# Patient Record
Sex: Female | Born: 1960 | Race: White | Hispanic: No | Marital: Married | Smoking: Former smoker
Health system: Southern US, Community
[De-identification: ages and names within clinical notes are randomized; demographics above are authoritative.]

## PROBLEM LIST (undated history)

## (undated) DIAGNOSIS — F329 Major depressive disorder, single episode, unspecified: Secondary | ICD-10-CM

## (undated) DIAGNOSIS — M1990 Unspecified osteoarthritis, unspecified site: Secondary | ICD-10-CM

## (undated) DIAGNOSIS — E282 Polycystic ovarian syndrome: Secondary | ICD-10-CM

## (undated) DIAGNOSIS — F419 Anxiety disorder, unspecified: Secondary | ICD-10-CM

## (undated) DIAGNOSIS — F32A Depression, unspecified: Secondary | ICD-10-CM

## (undated) HISTORY — DX: Polycystic ovarian syndrome: E28.2

## (undated) HISTORY — PX: KNEE SURGERY: SHX244

## (undated) HISTORY — DX: Major depressive disorder, single episode, unspecified: F32.9

## (undated) HISTORY — DX: Depression, unspecified: F32.A

## (undated) HISTORY — DX: Anxiety disorder, unspecified: F41.9

## (undated) HISTORY — DX: Unspecified osteoarthritis, unspecified site: M19.90

---

## 1982-01-20 HISTORY — PX: APPENDECTOMY: SHX54

## 2003-04-11 ENCOUNTER — Other Ambulatory Visit: Admission: RE | Admit: 2003-04-11 | Discharge: 2003-04-11 | Payer: Self-pay | Admitting: Obstetrics and Gynecology

## 2003-10-27 ENCOUNTER — Ambulatory Visit (HOSPITAL_COMMUNITY): Admission: RE | Admit: 2003-10-27 | Discharge: 2003-10-27 | Payer: Self-pay | Admitting: Obstetrics and Gynecology

## 2004-01-21 HISTORY — PX: LASIK: SHX215

## 2007-06-07 ENCOUNTER — Other Ambulatory Visit: Admission: RE | Admit: 2007-06-07 | Discharge: 2007-06-07 | Payer: Self-pay | Admitting: Family Medicine

## 2008-01-21 HISTORY — PX: BREAST BIOPSY: SHX20

## 2008-06-09 ENCOUNTER — Encounter: Admission: RE | Admit: 2008-06-09 | Discharge: 2008-06-09 | Payer: Self-pay | Admitting: Family Medicine

## 2008-06-13 ENCOUNTER — Encounter: Admission: RE | Admit: 2008-06-13 | Discharge: 2008-06-13 | Payer: Self-pay | Admitting: Family Medicine

## 2008-06-20 ENCOUNTER — Encounter: Admission: RE | Admit: 2008-06-20 | Discharge: 2008-06-20 | Payer: Self-pay | Admitting: Family Medicine

## 2009-01-05 ENCOUNTER — Other Ambulatory Visit: Admission: RE | Admit: 2009-01-05 | Discharge: 2009-01-05 | Payer: Self-pay | Admitting: Family Medicine

## 2009-03-02 ENCOUNTER — Encounter: Admission: RE | Admit: 2009-03-02 | Discharge: 2009-03-02 | Payer: Self-pay | Admitting: Family Medicine

## 2009-04-06 ENCOUNTER — Encounter: Admission: RE | Admit: 2009-04-06 | Discharge: 2009-04-06 | Payer: Self-pay | Admitting: Family Medicine

## 2010-02-10 ENCOUNTER — Encounter: Payer: Self-pay | Admitting: Family Medicine

## 2011-06-11 ENCOUNTER — Other Ambulatory Visit (HOSPITAL_COMMUNITY)
Admission: RE | Admit: 2011-06-11 | Discharge: 2011-06-11 | Disposition: A | Discharge status: Home / Self Care | Payer: Managed Care, Other (non HMO) | Source: Ambulatory Visit | Attending: Family Medicine | Admitting: Family Medicine

## 2011-06-11 ENCOUNTER — Other Ambulatory Visit: Payer: Self-pay | Admitting: Family Medicine

## 2011-06-11 DIAGNOSIS — Z113 Encounter for screening for infections with a predominantly sexual mode of transmission: Secondary | ICD-10-CM | POA: Insufficient documentation

## 2011-06-11 DIAGNOSIS — Z01419 Encounter for gynecological examination (general) (routine) without abnormal findings: Secondary | ICD-10-CM | POA: Insufficient documentation

## 2011-07-08 ENCOUNTER — Encounter (HOSPITAL_COMMUNITY): Payer: Self-pay | Admitting: Emergency Medicine

## 2011-07-08 ENCOUNTER — Emergency Department (HOSPITAL_COMMUNITY)
Admission: EM | Admit: 2011-07-08 | Discharge: 2011-07-08 | Disposition: A | Discharge status: Home Health | Payer: Managed Care, Other (non HMO) | Attending: Emergency Medicine | Admitting: Emergency Medicine

## 2011-07-08 ENCOUNTER — Emergency Department (HOSPITAL_COMMUNITY): Payer: Managed Care, Other (non HMO)

## 2011-07-08 DIAGNOSIS — R109 Unspecified abdominal pain: Secondary | ICD-10-CM

## 2011-07-08 LAB — BASIC METABOLIC PANEL
BUN: 10 mg/dL (ref 6–23)
CO2: 25 mEq/L (ref 19–32)
Calcium: 9.5 mg/dL (ref 8.4–10.5)
Chloride: 101 mEq/L (ref 96–112)
Creatinine, Ser: 0.85 mg/dL (ref 0.50–1.10)
GFR calc Af Amer: >90 mL/min (ref >90)
GFR calc non Af Amer: 79 mL/min — ABNORMAL LOW (ref >90)
Glucose, Bld: 98 mg/dL (ref 70–99)
Potassium: 3.6 mEq/L (ref 3.5–5.1)
Sodium: 138 mEq/L (ref 135–145)

## 2011-07-08 LAB — URINE MICROSCOPIC-ADD ON

## 2011-07-08 LAB — CBC
HCT: 40.9 % (ref 36.0–46.0)
Hemoglobin: 13.8 g/dL (ref 12.0–15.0)
MCH: 31.2 pg (ref 26.0–34.0)
MCHC: 33.7 g/dL (ref 30.0–36.0)
MCV: 92.3 fL (ref 78.0–100.0)
Platelets: 236 10*3/uL (ref 150–400)
RBC: 4.43 MIL/uL (ref 3.87–5.11)
RDW: 12.3 % (ref 11.5–15.5)
WBC: 11 10*3/uL — ABNORMAL HIGH (ref 4.0–10.5)

## 2011-07-08 LAB — DIFFERENTIAL
Basophils Absolute: 0 10*3/uL (ref 0.0–0.1)
Basophils Relative: 0 % (ref 0–1)
Eosinophils Absolute: 0.1 10*3/uL (ref 0.0–0.7)
Eosinophils Relative: 1 % (ref 0–5)
Lymphocytes Relative: 28 % (ref 12–46)
Lymphs Abs: 3 10*3/uL (ref 0.7–4.0)
Monocytes Absolute: 0.8 10*3/uL (ref 0.1–1.0)
Monocytes Relative: 8 % (ref 3–12)
Neutro Abs: 7 10*3/uL (ref 1.7–7.7)
Neutrophils Relative %: 64 % (ref 43–77)

## 2011-07-08 LAB — URINALYSIS, ROUTINE W REFLEX MICROSCOPIC
Bilirubin Urine: NEGATIVE
Glucose, UA: NEGATIVE mg/dL
Ketones, ur: 15 mg/dL — AB
Leukocytes, UA: NEGATIVE
Nitrite: NEGATIVE
Protein, ur: NEGATIVE mg/dL
Specific Gravity, Urine: 1.026 (ref 1.005–1.030)
Urobilinogen, UA: 1 mg/dL (ref 0.0–1.0)
pH: 5.5 (ref 5.0–8.0)

## 2011-07-08 LAB — PREGNANCY, URINE: Preg Test, Ur: NEGATIVE

## 2011-07-08 LAB — HEPATIC FUNCTION PANEL
ALT: 7 U/L (ref 0–35)
AST: 15 U/L (ref 0–37)
Albumin: 3.8 g/dL (ref 3.5–5.2)
Alkaline Phosphatase: 48 U/L (ref 39–117)
Bilirubin, Direct: 0.1 mg/dL (ref 0.0–0.3)
Indirect Bilirubin: 0.5 mg/dL (ref 0.3–0.9)
Total Bilirubin: 0.6 mg/dL (ref 0.3–1.2)
Total Protein: 7.5 g/dL (ref 6.0–8.3)

## 2011-07-08 LAB — LIPASE, BLOOD: Lipase: 33 U/L (ref 11–59)

## 2011-07-08 MED ORDER — ONDANSETRON HCL 4 MG/2ML IJ SOLN
4.0000 mg | Freq: Once | INTRAMUSCULAR | Status: AC
  Administered 2011-07-08: 4 mg via INTRAVENOUS
  Filled 2011-07-08: qty 2

## 2011-07-08 MED ORDER — SODIUM CHLORIDE 0.9 % IV BOLUS (SEPSIS)
1000.0000 mL | Freq: Once | INTRAVENOUS | Status: AC
  Administered 2011-07-08: 1000 mL via INTRAVENOUS

## 2011-07-08 MED ORDER — ONDANSETRON HCL 4 MG PO TABS: 4.0000 mg | ORAL_TABLET | Freq: Four times a day (QID) | ORAL | Status: AC

## 2011-07-08 MED ORDER — FENTANYL CITRATE 0.05 MG/ML IJ SOLN
50.0000 ug | Freq: Once | INTRAMUSCULAR | Status: AC
  Administered 2011-07-08: 50 ug via INTRAVENOUS
  Filled 2011-07-08: qty 2

## 2011-07-08 MED ORDER — HYDROCODONE-ACETAMINOPHEN 5-500 MG PO TABS: 1.0000 | ORAL_TABLET | Freq: Four times a day (QID) | ORAL | Status: AC | PRN

## 2011-07-08 MED ORDER — OMEPRAZOLE 20 MG PO CPDR: 40.0000 mg | DELAYED_RELEASE_CAPSULE | Freq: Every day | ORAL | Status: AC

## 2011-07-08 NOTE — ED Notes (Signed)
Patient reports that she is having waves of pain to her right upper quad area x 1 day with nausea and vomiting

## 2011-07-08 NOTE — ED Provider Notes (Signed)
History     CSN: [id number]  Arrival date & time 07/08/11  1540   First MD Initiated Contact with Patient 07/08/11 1637      Chief Complaint  Patient presents with  . Abdominal Pain    (Consider location/radiation/quality/duration/timing/severity/associated sxs/prior treatment) HPI Comments: Pt with hx of gallbladder pain/sludge in gallbladder about two years ago, presents with RUQ abd pain, waxing and waning, but worsening over last week.  Worse after eating.  Has had some bilious emesis.  No blood in stool/emesis.  No fevers.  No urinary problems.  Feels similar to past gallbladder attack  Patient is a 51 y.o. female presenting with abdominal pain. The history is provided by the patient.  Abdominal Pain The primary symptoms of the illness include abdominal pain, fever (subjective fever several days ago when pain started, none since then), nausea, vomiting and diarrhea. The primary symptoms of the illness do not include fatigue or shortness of breath. Episode onset: one week ago. The onset of the illness was gradual. The problem has been gradually worsening.  The illness is associated with eating. Symptoms associated with the illness do not include chills, diaphoresis, hematuria, frequency or back pain.    History reviewed. No pertinent past medical history.  Past Surgical History  Procedure Date  . Appendectomy   . Knee surgery     History reviewed. No pertinent family history.  History  Substance Use Topics  . Smoking status: Never Smoker   . Smokeless tobacco: Not on file  . Alcohol Use: No    OB History    Grav Para Term Preterm Abortions TAB SAB Ect Mult Living                  Review of Systems  Constitutional: Positive for fever (subjective fever several days ago when pain started, none since then). Negative for chills, diaphoresis and fatigue.  HENT: Negative for congestion, rhinorrhea and sneezing.   Eyes: Negative.   Respiratory: Negative for cough, chest  tightness and shortness of breath.   Cardiovascular: Negative for chest pain and leg swelling.  Gastrointestinal: Positive for nausea, vomiting, abdominal pain and diarrhea. Negative for blood in stool.  Genitourinary: Negative for frequency, hematuria, flank pain and difficulty urinating.  Musculoskeletal: Negative for back pain and arthralgias.  Skin: Negative for rash.  Neurological: Negative for dizziness, speech difficulty, weakness, numbness and headaches.    Allergies  Benzodiazepines  Home Medications   Current Outpatient Rx  Name Route Sig Dispense Refill  . NORGESTIM-ETH ESTRAD TRIPHASIC 0.18/0.215/0.25 MG-25 MCG PO TABS Oral Take 1 tablet by mouth daily.    Marland Kitchen HYDROCODONE-ACETAMINOPHEN 5-500 MG PO TABS Oral Take 1-2 tablets by mouth every 6 (six) hours as needed for pain. 15 tablet 0  . OMEPRAZOLE 20 MG PO CPDR Oral Take 2 capsules (40 mg total) by mouth daily. 30 capsule 0  . ONDANSETRON HCL 4 MG PO TABS Oral Take 1 tablet (4 mg total) by mouth every 6 (six) hours. 12 tablet 0    BP 133/73  Pulse 69  Temp 98.4 F (36.9 C) (Oral)  Resp 20  SpO2 99%  LMP 06/07/2011  Physical Exam  Constitutional: She is oriented to person, place, and time. She appears well-developed and well-nourished.  HENT:  Head: Normocephalic and atraumatic.  Eyes: Pupils are equal, round, and reactive to light.  Neck: Normal range of motion. Neck supple.  Cardiovascular: Normal rate, regular rhythm and normal heart sounds.   Pulmonary/Chest: Effort normal and breath sounds  normal. No respiratory distress. She has no wheezes. She has no rales. She exhibits no tenderness.  Abdominal: Soft. Bowel sounds are normal. There is tenderness. There is no rebound and no guarding.       Moderate tenderness to RUQ  Musculoskeletal: Normal range of motion. She exhibits no edema.  Lymphadenopathy:    She has no cervical adenopathy.  Neurological: She is alert and oriented to person, place, and time.  Skin:  Skin is warm and dry. No rash noted.  Psychiatric: She has a normal mood and affect.    ED Course  Procedures (including critical care time)  Results for orders placed during the hospital encounter of 07/08/11  CBC      Component Value Range   WBC 11.0 (*) 4.0 - 10.5 K/uL   RBC 4.43  3.87 - 5.11 MIL/uL   Hemoglobin 13.8  12.0 - 15.0 g/dL   HCT 40.9  81.1 - 91.4 %   MCV 92.3  78.0 - 100.0 fL   MCH 31.2  26.0 - 34.0 pg   MCHC 33.7  30.0 - 36.0 g/dL   RDW 78.2  95.6 - 21.3 %   Platelets 236  150 - 400 K/uL  DIFFERENTIAL      Component Value Range   Neutrophils Relative 64  43 - 77 %   Neutro Abs 7.0  1.7 - 7.7 K/uL   Lymphocytes Relative 28  12 - 46 %   Lymphs Abs 3.0  0.7 - 4.0 K/uL   Monocytes Relative 8  3 - 12 %   Monocytes Absolute 0.8  0.1 - 1.0 K/uL   Eosinophils Relative 1  0 - 5 %   Eosinophils Absolute 0.1  0.0 - 0.7 K/uL   Basophils Relative 0  0 - 1 %   Basophils Absolute 0.0  0.0 - 0.1 K/uL  BASIC METABOLIC PANEL      Component Value Range   Sodium 138  135 - 145 mEq/L   Potassium 3.6  3.5 - 5.1 mEq/L   Chloride 101  96 - 112 mEq/L   CO2 25  19 - 32 mEq/L   Glucose, Bld 98  70 - 99 mg/dL   BUN 10  6 - 23 mg/dL   Creatinine, Ser 0.86  0.50 - 1.10 mg/dL   Calcium 9.5  8.4 - 57.8 mg/dL   GFR calc non Af Amer 79 (*) >90 mL/min   GFR calc Af Amer >90  >90 mL/min  URINALYSIS, ROUTINE W REFLEX MICROSCOPIC      Component Value Range   Color, Urine YELLOW  YELLOW   APPearance CLOUDY (*) CLEAR   Specific Gravity, Urine 1.026  1.005 - 1.030   pH 5.5  5.0 - 8.0   Glucose, UA NEGATIVE  NEGATIVE mg/dL   Hgb urine dipstick LARGE (*) NEGATIVE   Bilirubin Urine NEGATIVE  NEGATIVE   Ketones, ur 15 (*) NEGATIVE mg/dL   Protein, ur NEGATIVE  NEGATIVE mg/dL   Urobilinogen, UA 1.0  0.0 - 1.0 mg/dL   Nitrite NEGATIVE  NEGATIVE   Leukocytes, UA NEGATIVE  NEGATIVE  LIPASE, BLOOD      Component Value Range   Lipase 33  11 - 59 U/L  HEPATIC FUNCTION PANEL      Component  Value Range   Total Protein 7.5  6.0 - 8.3 g/dL   Albumin 3.8  3.5 - 5.2 g/dL   AST 15  0 - 37 U/L   ALT 7  0 -  35 U/L   Alkaline Phosphatase 48  39 - 117 U/L   Total Bilirubin 0.6  0.3 - 1.2 mg/dL   Bilirubin, Direct 0.1  0.0 - 0.3 mg/dL   Indirect Bilirubin 0.5  0.3 - 0.9 mg/dL  PREGNANCY, URINE      Component Value Range   Preg Test, Ur NEGATIVE  NEGATIVE  URINE MICROSCOPIC-ADD ON      Component Value Range   Squamous Epithelial / LPF FEW (*) RARE   WBC, UA 0-2  <3 WBC/hpf   RBC / HPF 7-10  <3 RBC/hpf   Bacteria, UA RARE  RARE   US Abdomen Complete  07/08/2011  *RADIOLOGY REPORT*  Clinical Data:   right upper quadrant pain  COMPLETE ABDOMINAL ULTRASOUND  Comparison: 04/06/2009  Findings:  Gallbladder:  No gallstones, gallbladder wall thickening, or pericholecystic fluid. No sonographic Murphy's sign.  Common bile duct:  Measures 5 mm in diameter within normal limits.  Liver:  No focal lesion identified.  Within normal limits in parenchymal echogenicity.  IVC:  Appears normal.  Pancreas:  Limited visualization due to bowel gas.  Spleen:  Measures 10.7 cm in length.  Normal echogenicity.  Right Kidney:  Measures 11.6 cm in length.  No mass, hydronephrosis or diagnostic renal calculus  Left Kidney:  Measures 12.6 cm in length.  No mass, hydronephrosis or diagnostic renal calculus  Abdominal aorta:  No aneurysm identified. Measures up to 2.2 cm in diameter.  IMPRESSION: Negative abdominal ultrasound.  Original Report Authenticated By: Natasha Mead, M.D.     No results found.    1. Abdominal pain       MDM  No evidence of cholecystitis, pancreatitis.  Pt denies wanting any more pain meds.  Appears comfortable.  Will d/c.  Start on PPI, f/u with PMD.  Advised to return for any worsening symptoms        Rolan Bucco, MD 07/08/11 1921

## 2011-07-09 ENCOUNTER — Encounter (INDEPENDENT_AMBULATORY_CARE_PROVIDER_SITE_OTHER): Payer: Self-pay | Admitting: General Surgery

## 2011-07-09 ENCOUNTER — Other Ambulatory Visit: Payer: Self-pay | Admitting: Family Medicine

## 2011-07-09 DIAGNOSIS — Z1231 Encounter for screening mammogram for malignant neoplasm of breast: Secondary | ICD-10-CM

## 2011-07-25 ENCOUNTER — Ambulatory Visit
Admission: RE | Admit: 2011-07-25 | Discharge: 2011-07-25 | Disposition: A | Discharge status: Home / Self Care | Payer: Managed Care, Other (non HMO) | Source: Ambulatory Visit | Attending: Family Medicine | Admitting: Family Medicine

## 2011-07-25 DIAGNOSIS — Z1231 Encounter for screening mammogram for malignant neoplasm of breast: Secondary | ICD-10-CM

## 2011-07-30 ENCOUNTER — Other Ambulatory Visit: Payer: Self-pay | Admitting: Family Medicine

## 2011-07-30 DIAGNOSIS — R928 Other abnormal and inconclusive findings on diagnostic imaging of breast: Secondary | ICD-10-CM

## 2011-08-06 ENCOUNTER — Ambulatory Visit (INDEPENDENT_AMBULATORY_CARE_PROVIDER_SITE_OTHER): Payer: Self-pay | Admitting: Surgery

## 2011-08-11 ENCOUNTER — Ambulatory Visit
Admission: RE | Admit: 2011-08-11 | Discharge: 2011-08-11 | Disposition: A | Discharge status: Home / Self Care | Payer: Managed Care, Other (non HMO) | Source: Ambulatory Visit | Attending: Family Medicine | Admitting: Family Medicine

## 2011-08-11 ENCOUNTER — Encounter (INDEPENDENT_AMBULATORY_CARE_PROVIDER_SITE_OTHER): Payer: Self-pay | Admitting: Surgery

## 2011-08-11 DIAGNOSIS — R928 Other abnormal and inconclusive findings on diagnostic imaging of breast: Secondary | ICD-10-CM

## 2012-08-25 ENCOUNTER — Other Ambulatory Visit (HOSPITAL_COMMUNITY)
Admission: RE | Admit: 2012-08-25 | Discharge: 2012-08-25 | Disposition: A | Discharge status: Home / Self Care | Payer: BC Managed Care – PPO | Source: Ambulatory Visit | Attending: Family Medicine | Admitting: Family Medicine

## 2012-08-25 ENCOUNTER — Other Ambulatory Visit: Payer: Self-pay | Admitting: Family Medicine

## 2012-08-25 DIAGNOSIS — Z01419 Encounter for gynecological examination (general) (routine) without abnormal findings: Secondary | ICD-10-CM | POA: Insufficient documentation

## 2012-10-07 ENCOUNTER — Other Ambulatory Visit: Payer: Self-pay

## 2012-10-07 DIAGNOSIS — Z1231 Encounter for screening mammogram for malignant neoplasm of breast: Secondary | ICD-10-CM

## 2012-10-21 ENCOUNTER — Ambulatory Visit
Admission: RE | Admit: 2012-10-21 | Discharge: 2012-10-21 | Disposition: A | Discharge status: Home / Self Care | Payer: BC Managed Care – PPO | Source: Ambulatory Visit

## 2012-10-21 DIAGNOSIS — Z1231 Encounter for screening mammogram for malignant neoplasm of breast: Secondary | ICD-10-CM

## 2014-03-27 ENCOUNTER — Other Ambulatory Visit (HOSPITAL_COMMUNITY): Payer: Managed Care, Other (non HMO)

## 2014-04-12 ENCOUNTER — Inpatient Hospital Stay (HOSPITAL_COMMUNITY)
Admission: RE | Admit: 2014-04-12 | Payer: Managed Care, Other (non HMO) | Source: Ambulatory Visit | Admitting: Orthopedic Surgery

## 2014-04-12 ENCOUNTER — Encounter (HOSPITAL_COMMUNITY): Admission: RE | Payer: Self-pay | Source: Ambulatory Visit

## 2014-04-12 SURGERY — ARTHROPLASTY, KNEE, TOTAL
Anesthesia: General | Site: Knee | Laterality: Left

## 2015-04-23 DIAGNOSIS — F4323 Adjustment disorder with mixed anxiety and depressed mood: Secondary | ICD-10-CM | POA: Diagnosis not present

## 2015-05-14 DIAGNOSIS — F4323 Adjustment disorder with mixed anxiety and depressed mood: Secondary | ICD-10-CM | POA: Diagnosis not present

## 2015-05-28 DIAGNOSIS — F4323 Adjustment disorder with mixed anxiety and depressed mood: Secondary | ICD-10-CM | POA: Diagnosis not present

## 2015-06-11 DIAGNOSIS — F4323 Adjustment disorder with mixed anxiety and depressed mood: Secondary | ICD-10-CM | POA: Diagnosis not present

## 2015-06-25 DIAGNOSIS — F4323 Adjustment disorder with mixed anxiety and depressed mood: Secondary | ICD-10-CM | POA: Diagnosis not present

## 2015-07-09 DIAGNOSIS — F4323 Adjustment disorder with mixed anxiety and depressed mood: Secondary | ICD-10-CM | POA: Diagnosis not present

## 2015-07-30 DIAGNOSIS — F4323 Adjustment disorder with mixed anxiety and depressed mood: Secondary | ICD-10-CM | POA: Diagnosis not present

## 2015-08-13 DIAGNOSIS — F4323 Adjustment disorder with mixed anxiety and depressed mood: Secondary | ICD-10-CM | POA: Diagnosis not present

## 2015-08-27 DIAGNOSIS — F4323 Adjustment disorder with mixed anxiety and depressed mood: Secondary | ICD-10-CM | POA: Diagnosis not present

## 2015-09-10 DIAGNOSIS — F4323 Adjustment disorder with mixed anxiety and depressed mood: Secondary | ICD-10-CM | POA: Diagnosis not present

## 2015-09-25 DIAGNOSIS — F4323 Adjustment disorder with mixed anxiety and depressed mood: Secondary | ICD-10-CM | POA: Diagnosis not present

## 2015-10-08 DIAGNOSIS — F4323 Adjustment disorder with mixed anxiety and depressed mood: Secondary | ICD-10-CM | POA: Diagnosis not present

## 2015-10-22 DIAGNOSIS — F4323 Adjustment disorder with mixed anxiety and depressed mood: Secondary | ICD-10-CM | POA: Diagnosis not present

## 2015-11-05 DIAGNOSIS — F4323 Adjustment disorder with mixed anxiety and depressed mood: Secondary | ICD-10-CM | POA: Diagnosis not present

## 2015-11-20 DIAGNOSIS — F4323 Adjustment disorder with mixed anxiety and depressed mood: Secondary | ICD-10-CM | POA: Diagnosis not present

## 2015-12-04 DIAGNOSIS — F4323 Adjustment disorder with mixed anxiety and depressed mood: Secondary | ICD-10-CM | POA: Diagnosis not present

## 2015-12-17 DIAGNOSIS — F4323 Adjustment disorder with mixed anxiety and depressed mood: Secondary | ICD-10-CM | POA: Diagnosis not present

## 2016-01-01 DIAGNOSIS — F4323 Adjustment disorder with mixed anxiety and depressed mood: Secondary | ICD-10-CM | POA: Diagnosis not present

## 2016-02-05 DIAGNOSIS — F4323 Adjustment disorder with mixed anxiety and depressed mood: Secondary | ICD-10-CM | POA: Diagnosis not present

## 2016-02-19 DIAGNOSIS — F4323 Adjustment disorder with mixed anxiety and depressed mood: Secondary | ICD-10-CM | POA: Diagnosis not present

## 2016-03-04 DIAGNOSIS — F4323 Adjustment disorder with mixed anxiety and depressed mood: Secondary | ICD-10-CM | POA: Diagnosis not present

## 2016-03-18 DIAGNOSIS — F4323 Adjustment disorder with mixed anxiety and depressed mood: Secondary | ICD-10-CM | POA: Diagnosis not present

## 2016-04-01 DIAGNOSIS — F4323 Adjustment disorder with mixed anxiety and depressed mood: Secondary | ICD-10-CM | POA: Diagnosis not present

## 2016-04-15 DIAGNOSIS — F4323 Adjustment disorder with mixed anxiety and depressed mood: Secondary | ICD-10-CM | POA: Diagnosis not present

## 2016-04-29 DIAGNOSIS — F4323 Adjustment disorder with mixed anxiety and depressed mood: Secondary | ICD-10-CM | POA: Diagnosis not present

## 2016-05-13 DIAGNOSIS — F4323 Adjustment disorder with mixed anxiety and depressed mood: Secondary | ICD-10-CM | POA: Diagnosis not present

## 2016-05-27 DIAGNOSIS — F4323 Adjustment disorder with mixed anxiety and depressed mood: Secondary | ICD-10-CM | POA: Diagnosis not present

## 2016-06-03 ENCOUNTER — Other Ambulatory Visit: Payer: Self-pay | Admitting: Internal Medicine

## 2016-06-03 DIAGNOSIS — E7800 Pure hypercholesterolemia, unspecified: Secondary | ICD-10-CM | POA: Diagnosis not present

## 2016-06-03 DIAGNOSIS — Z119 Encounter for screening for infectious and parasitic diseases, unspecified: Secondary | ICD-10-CM | POA: Diagnosis not present

## 2016-06-03 DIAGNOSIS — R030 Elevated blood-pressure reading, without diagnosis of hypertension: Secondary | ICD-10-CM | POA: Diagnosis not present

## 2016-06-03 DIAGNOSIS — E559 Vitamin D deficiency, unspecified: Secondary | ICD-10-CM | POA: Diagnosis not present

## 2016-06-03 DIAGNOSIS — Z1231 Encounter for screening mammogram for malignant neoplasm of breast: Secondary | ICD-10-CM

## 2016-06-03 DIAGNOSIS — L509 Urticaria, unspecified: Secondary | ICD-10-CM | POA: Diagnosis not present

## 2016-06-03 DIAGNOSIS — E669 Obesity, unspecified: Secondary | ICD-10-CM | POA: Diagnosis not present

## 2016-06-10 DIAGNOSIS — F4323 Adjustment disorder with mixed anxiety and depressed mood: Secondary | ICD-10-CM | POA: Diagnosis not present

## 2016-06-19 ENCOUNTER — Encounter: Payer: Self-pay | Admitting: Registered"

## 2016-06-19 ENCOUNTER — Encounter: Payer: BLUE CROSS/BLUE SHIELD | Attending: Family Medicine | Admitting: Registered"

## 2016-06-19 DIAGNOSIS — E669 Obesity, unspecified: Secondary | ICD-10-CM

## 2016-06-19 DIAGNOSIS — Z713 Dietary counseling and surveillance: Secondary | ICD-10-CM | POA: Diagnosis not present

## 2016-06-19 NOTE — Patient Instructions (Signed)
Make sure to get in 3 meals per day. Try to eat balanced meals (see handout). This can help reduce cravings during the day from being overly hungry.   For breakfast-try to plan out easy things to eat for breakfast to help work through barriers. Maybe have some yogurt and fruit ready in the refrigerator or other easy to grab items for breakfast.   Try to drink more water. Maybe set a reminder on your phone or a sticky note to remind yourself to drink more water. This can help reduce cravings as well.     If you feel you are wanting to snack due to boredom or stress and not hunger, try to find another activity to do for at least 20 minutes.   For snacks- including snacks high in fiber and also including some protein can help with satiety.

## 2016-06-19 NOTE — Progress Notes (Signed)
Medical Nutrition Therapy:  Appt start time: 0930 end time: 1030.   Assessment:  Primary concerns today: Pt referred for obesity. Pt says she would like help with weight loss. Pt says she did Weight Watchers in the past and lost down to ~130 lb. Pt says she has a lot of cravings and that life stress has worsened her food cravings. Pt says she is a single mother with two teens and works as an Acupuncturistoccupational therapist. Pt says she has been gradually gaining more weight over the last few years. Pt says she wants to lose weight before she gets a knee replacement.   Food Allergies: Pt says she had an adverse reaction to walnuts and has avoided them since. Pt says she can eat other types of nuts.   Preferred Learning Style:   No preference indicated   Learning Readiness:   Ready  MEDICATIONS: See list.    DIETARY INTAKE:  Usual eating pattern includes 2 meals and 1 snacks per day.  Everyday foods include bread.  Avoided foods include beets, walnuts.    Per pt, meals at home are eaten together as a family at the table without electronics. Lunch is usually eaten in the office at work.   Pt says she struggles with eating breakfast but she knows she needs to eat it and that when she does not she wants to snack more during the day.   Pt says she is a stress eater. She says she craves salty carbohydrate snacks-pretzels, pita chips, bread.   24-hr recall:  B ( AM): bagel with 1 slice of ham  Snk ( AM): None reported.  L ( PM): None reported Snk ( PM): 4 peanut butter crackers D ( PM): Hamburger helper with noodles, gravy, apple Snk ( PM): None reported.  Beverages: 3 cups of coffee and lemonade.   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 Per pt, she gets in 8-10,000 steps at work. Pt says her knee limits her PA.  Progress Towards Goal(s):  In progress.   Nutritional Diagnosis:  NI-5.11.1 Predicted suboptimal nutrient intake As related to skipping meals.   As evidenced by pt's diet recall.    Intervention:  Nutrition counseling provided. Dietitian educated pt regarding how to plan balanced meals and counseled pt on the importance of eating 3 meals per day. Dietitian discussed with pt how skipping meals can lead to becoming overly hungry and binge eating later in the day. Pt says she struggles with getting in breakfast and that she feels likes it is because she thinks once she starts eating she will not be able to stop. Pt says she knows this is not true and that eating regular meals will help reduce uncontrolled cravings. Dietitian encouraged pt to plan out her breakfasts beforehand and have easy to grab items ready for breakfast in the morning to help work through this barrier. Pt says she often feels she needs a snack while she is doing certain activities. Dietitian encouraged pt to eliminate distractions when she has her snacks as well as her meals to promote mindful eating and to help avoid snacking due to habit rather than hunger. Pt says she sometimes wants to eat because she is bored or stressed. Dietitian encouraged pt to find another activity to do if she is bored or stressed that does not involve food such as taking a walk or reading, etc. Dietitian encouraged pt to drink more water and talked to pt about how  thirst can be mistaken for hunger. Pt wanted to know of snacks that can help improve satiety. Dietitian recommended pt choose snacks higher in fiber and protein to help with satiety. Pt says she plans to focus on getting in breakfast each morning and drinking more water throughout the day for the next couple weeks.   Goals:   Make sure to get in 3 meals per day. Try to eat balanced meals (see handout). This can help reduce cravings during the day from being overly hungry.   For breakfast-try to plan out easy things to eat for breakfast to help work through barriers. Maybe have some yogurt and fruit ready in the refrigerator or other easy to grab  items for breakfast.   Try to drink more water. Maybe set a reminder on your phone or a sticky note to remind yourself to drink more water. This can help reduce cravings as well.     If you feel you are wanting to snack due to boredom or stress and not hunger, try to find another activity to do for at least 20 minutes.   For snacks- including snacks high in fiber and also including some protein can help with satiety.   Teaching Method Utilized:  Auditory  Handouts given during visit include:  Balanced plate  Healthy plate handout (with breakfast example)  Barriers to learning/adherence to lifestyle change: None indicated.   Demonstrated degree of understanding via:  Teach Back   Monitoring/Evaluation:  Dietary intake, exercise,  and body weight in 1 month(s).

## 2016-06-24 ENCOUNTER — Ambulatory Visit
Admission: RE | Admit: 2016-06-24 | Discharge: 2016-06-24 | Disposition: A | Discharge status: Home / Self Care | Payer: BLUE CROSS/BLUE SHIELD | Source: Ambulatory Visit | Attending: Internal Medicine | Admitting: Internal Medicine

## 2016-06-24 DIAGNOSIS — Z1231 Encounter for screening mammogram for malignant neoplasm of breast: Secondary | ICD-10-CM | POA: Diagnosis not present

## 2016-06-24 DIAGNOSIS — F4323 Adjustment disorder with mixed anxiety and depressed mood: Secondary | ICD-10-CM | POA: Diagnosis not present

## 2016-07-08 DIAGNOSIS — F4323 Adjustment disorder with mixed anxiety and depressed mood: Secondary | ICD-10-CM | POA: Diagnosis not present

## 2016-07-09 ENCOUNTER — Other Ambulatory Visit (HOSPITAL_COMMUNITY)
Admission: RE | Admit: 2016-07-09 | Discharge: 2016-07-09 | Disposition: A | Discharge status: Home / Self Care | Payer: BLUE CROSS/BLUE SHIELD | Source: Ambulatory Visit | Attending: Internal Medicine | Admitting: Internal Medicine

## 2016-07-09 ENCOUNTER — Other Ambulatory Visit: Payer: Self-pay | Admitting: Internal Medicine

## 2016-07-09 DIAGNOSIS — L509 Urticaria, unspecified: Secondary | ICD-10-CM | POA: Diagnosis not present

## 2016-07-09 DIAGNOSIS — Z124 Encounter for screening for malignant neoplasm of cervix: Secondary | ICD-10-CM | POA: Diagnosis not present

## 2016-07-09 DIAGNOSIS — E669 Obesity, unspecified: Secondary | ICD-10-CM | POA: Diagnosis not present

## 2016-07-09 DIAGNOSIS — E559 Vitamin D deficiency, unspecified: Secondary | ICD-10-CM | POA: Diagnosis not present

## 2016-07-09 DIAGNOSIS — Z0001 Encounter for general adult medical examination with abnormal findings: Secondary | ICD-10-CM | POA: Diagnosis not present

## 2016-07-09 DIAGNOSIS — T7840XA Allergy, unspecified, initial encounter: Secondary | ICD-10-CM | POA: Diagnosis not present

## 2016-07-15 LAB — CYTOLOGY - PAP
Adequacy: ABSENT
Diagnosis: NEGATIVE
HPV: NOT DETECTED

## 2016-07-21 ENCOUNTER — Ambulatory Visit: Payer: Managed Care, Other (non HMO) | Admitting: Registered"

## 2016-07-22 DIAGNOSIS — F4323 Adjustment disorder with mixed anxiety and depressed mood: Secondary | ICD-10-CM | POA: Diagnosis not present

## 2016-08-05 DIAGNOSIS — F4323 Adjustment disorder with mixed anxiety and depressed mood: Secondary | ICD-10-CM | POA: Diagnosis not present

## 2016-08-19 DIAGNOSIS — F4323 Adjustment disorder with mixed anxiety and depressed mood: Secondary | ICD-10-CM | POA: Diagnosis not present

## 2016-09-02 DIAGNOSIS — F4323 Adjustment disorder with mixed anxiety and depressed mood: Secondary | ICD-10-CM | POA: Diagnosis not present

## 2016-09-16 DIAGNOSIS — F4323 Adjustment disorder with mixed anxiety and depressed mood: Secondary | ICD-10-CM | POA: Diagnosis not present

## 2016-09-30 DIAGNOSIS — F4323 Adjustment disorder with mixed anxiety and depressed mood: Secondary | ICD-10-CM | POA: Diagnosis not present

## 2016-10-03 ENCOUNTER — Other Ambulatory Visit: Payer: Self-pay | Admitting: Internal Medicine

## 2016-10-03 DIAGNOSIS — R1011 Right upper quadrant pain: Secondary | ICD-10-CM

## 2016-10-03 DIAGNOSIS — B370 Candidal stomatitis: Secondary | ICD-10-CM | POA: Diagnosis not present

## 2016-10-03 DIAGNOSIS — M1710 Unilateral primary osteoarthritis, unspecified knee: Secondary | ICD-10-CM | POA: Diagnosis not present

## 2016-10-06 ENCOUNTER — Other Ambulatory Visit: Payer: BLUE CROSS/BLUE SHIELD

## 2016-10-14 DIAGNOSIS — F4323 Adjustment disorder with mixed anxiety and depressed mood: Secondary | ICD-10-CM | POA: Diagnosis not present

## 2016-10-16 DIAGNOSIS — M1712 Unilateral primary osteoarthritis, left knee: Secondary | ICD-10-CM | POA: Diagnosis not present

## 2016-10-20 DIAGNOSIS — R1011 Right upper quadrant pain: Secondary | ICD-10-CM | POA: Diagnosis not present

## 2016-10-20 DIAGNOSIS — M1990 Unspecified osteoarthritis, unspecified site: Secondary | ICD-10-CM | POA: Diagnosis not present

## 2016-10-20 DIAGNOSIS — B370 Candidal stomatitis: Secondary | ICD-10-CM | POA: Diagnosis not present

## 2016-10-28 DIAGNOSIS — R1011 Right upper quadrant pain: Secondary | ICD-10-CM | POA: Diagnosis not present

## 2016-11-03 ENCOUNTER — Ambulatory Visit
Admission: RE | Admit: 2016-11-03 | Discharge: 2016-11-03 | Disposition: A | Discharge status: Home / Self Care | Payer: BLUE CROSS/BLUE SHIELD | Source: Ambulatory Visit | Attending: Internal Medicine | Admitting: Internal Medicine

## 2016-11-03 DIAGNOSIS — R1011 Right upper quadrant pain: Secondary | ICD-10-CM | POA: Diagnosis not present

## 2016-11-03 DIAGNOSIS — M1712 Unilateral primary osteoarthritis, left knee: Secondary | ICD-10-CM | POA: Diagnosis not present

## 2016-11-10 DIAGNOSIS — M1712 Unilateral primary osteoarthritis, left knee: Secondary | ICD-10-CM | POA: Diagnosis not present

## 2016-11-11 DIAGNOSIS — F4323 Adjustment disorder with mixed anxiety and depressed mood: Secondary | ICD-10-CM | POA: Diagnosis not present

## 2016-11-12 DIAGNOSIS — R1011 Right upper quadrant pain: Secondary | ICD-10-CM | POA: Diagnosis not present

## 2016-11-12 DIAGNOSIS — R7309 Other abnormal glucose: Secondary | ICD-10-CM | POA: Diagnosis not present

## 2016-11-12 DIAGNOSIS — K279 Peptic ulcer, site unspecified, unspecified as acute or chronic, without hemorrhage or perforation: Secondary | ICD-10-CM | POA: Diagnosis not present

## 2016-11-25 DIAGNOSIS — F4323 Adjustment disorder with mixed anxiety and depressed mood: Secondary | ICD-10-CM | POA: Diagnosis not present

## 2016-12-04 ENCOUNTER — Other Ambulatory Visit (HOSPITAL_COMMUNITY): Payer: Self-pay | Admitting: General Surgery

## 2016-12-04 DIAGNOSIS — R1011 Right upper quadrant pain: Secondary | ICD-10-CM

## 2016-12-09 DIAGNOSIS — F331 Major depressive disorder, recurrent, moderate: Secondary | ICD-10-CM | POA: Diagnosis not present

## 2016-12-17 ENCOUNTER — Ambulatory Visit (HOSPITAL_COMMUNITY)
Admission: RE | Admit: 2016-12-17 | Discharge: 2016-12-17 | Disposition: A | Discharge status: Home / Self Care | Payer: BLUE CROSS/BLUE SHIELD | Source: Ambulatory Visit | Attending: General Surgery | Admitting: General Surgery

## 2016-12-17 DIAGNOSIS — R1011 Right upper quadrant pain: Secondary | ICD-10-CM | POA: Diagnosis not present

## 2016-12-17 DIAGNOSIS — R112 Nausea with vomiting, unspecified: Secondary | ICD-10-CM | POA: Diagnosis not present

## 2016-12-17 MED ORDER — TECHNETIUM TC 99M MEBROFENIN IV KIT
5.0000 | PACK | Freq: Once | INTRAVENOUS | Status: AC | PRN
  Administered 2016-12-17: 5 via INTRAVENOUS

## 2016-12-23 DIAGNOSIS — F4323 Adjustment disorder with mixed anxiety and depressed mood: Secondary | ICD-10-CM | POA: Diagnosis not present

## 2017-01-06 DIAGNOSIS — F4323 Adjustment disorder with mixed anxiety and depressed mood: Secondary | ICD-10-CM | POA: Diagnosis not present

## 2017-02-03 DIAGNOSIS — F3341 Major depressive disorder, recurrent, in partial remission: Secondary | ICD-10-CM | POA: Diagnosis not present

## 2017-03-03 DIAGNOSIS — F3341 Major depressive disorder, recurrent, in partial remission: Secondary | ICD-10-CM | POA: Diagnosis not present

## 2017-03-31 DIAGNOSIS — F3341 Major depressive disorder, recurrent, in partial remission: Secondary | ICD-10-CM | POA: Diagnosis not present

## 2017-04-28 DIAGNOSIS — F3341 Major depressive disorder, recurrent, in partial remission: Secondary | ICD-10-CM | POA: Diagnosis not present

## 2017-05-26 DIAGNOSIS — F3341 Major depressive disorder, recurrent, in partial remission: Secondary | ICD-10-CM | POA: Diagnosis not present

## 2017-06-23 DIAGNOSIS — F330 Major depressive disorder, recurrent, mild: Secondary | ICD-10-CM | POA: Diagnosis not present

## 2017-06-25 DIAGNOSIS — H5203 Hypermetropia, bilateral: Secondary | ICD-10-CM | POA: Diagnosis not present

## 2017-06-25 DIAGNOSIS — H524 Presbyopia: Secondary | ICD-10-CM | POA: Diagnosis not present

## 2017-06-25 DIAGNOSIS — H52223 Regular astigmatism, bilateral: Secondary | ICD-10-CM | POA: Diagnosis not present

## 2017-07-28 DIAGNOSIS — F330 Major depressive disorder, recurrent, mild: Secondary | ICD-10-CM | POA: Diagnosis not present

## 2017-08-25 DIAGNOSIS — F330 Major depressive disorder, recurrent, mild: Secondary | ICD-10-CM | POA: Diagnosis not present

## 2017-09-11 DIAGNOSIS — L03012 Cellulitis of left finger: Secondary | ICD-10-CM | POA: Diagnosis not present

## 2017-09-22 DIAGNOSIS — F330 Major depressive disorder, recurrent, mild: Secondary | ICD-10-CM | POA: Diagnosis not present

## 2017-10-20 DIAGNOSIS — F330 Major depressive disorder, recurrent, mild: Secondary | ICD-10-CM | POA: Diagnosis not present

## 2017-11-24 DIAGNOSIS — F330 Major depressive disorder, recurrent, mild: Secondary | ICD-10-CM | POA: Diagnosis not present

## 2017-12-21 DIAGNOSIS — M1712 Unilateral primary osteoarthritis, left knee: Secondary | ICD-10-CM | POA: Diagnosis not present

## 2017-12-22 DIAGNOSIS — F330 Major depressive disorder, recurrent, mild: Secondary | ICD-10-CM | POA: Diagnosis not present

## 2018-01-07 DIAGNOSIS — M1712 Unilateral primary osteoarthritis, left knee: Secondary | ICD-10-CM | POA: Diagnosis not present

## 2018-01-14 DIAGNOSIS — M1712 Unilateral primary osteoarthritis, left knee: Secondary | ICD-10-CM | POA: Diagnosis not present

## 2018-01-22 DIAGNOSIS — M1712 Unilateral primary osteoarthritis, left knee: Secondary | ICD-10-CM | POA: Diagnosis not present

## 2018-01-26 DIAGNOSIS — F330 Major depressive disorder, recurrent, mild: Secondary | ICD-10-CM | POA: Diagnosis not present

## 2018-02-23 DIAGNOSIS — F330 Major depressive disorder, recurrent, mild: Secondary | ICD-10-CM | POA: Diagnosis not present

## 2018-03-23 DIAGNOSIS — F330 Major depressive disorder, recurrent, mild: Secondary | ICD-10-CM | POA: Diagnosis not present

## 2018-04-27 DIAGNOSIS — F330 Major depressive disorder, recurrent, mild: Secondary | ICD-10-CM | POA: Diagnosis not present

## 2018-05-25 DIAGNOSIS — F330 Major depressive disorder, recurrent, mild: Secondary | ICD-10-CM | POA: Diagnosis not present

## 2018-06-29 DIAGNOSIS — F330 Major depressive disorder, recurrent, mild: Secondary | ICD-10-CM | POA: Diagnosis not present

## 2018-07-30 DIAGNOSIS — F330 Major depressive disorder, recurrent, mild: Secondary | ICD-10-CM | POA: Diagnosis not present

## 2018-09-10 DIAGNOSIS — F330 Major depressive disorder, recurrent, mild: Secondary | ICD-10-CM | POA: Diagnosis not present

## 2018-10-07 DIAGNOSIS — F330 Major depressive disorder, recurrent, mild: Secondary | ICD-10-CM | POA: Diagnosis not present

## 2018-10-12 DIAGNOSIS — Z20828 Contact with and (suspected) exposure to other viral communicable diseases: Secondary | ICD-10-CM | POA: Diagnosis not present

## 2018-11-04 DIAGNOSIS — F330 Major depressive disorder, recurrent, mild: Secondary | ICD-10-CM | POA: Diagnosis not present

## 2018-12-09 DIAGNOSIS — F330 Major depressive disorder, recurrent, mild: Secondary | ICD-10-CM | POA: Diagnosis not present

## 2019-01-03 ENCOUNTER — Other Ambulatory Visit: Payer: Self-pay | Admitting: Internal Medicine

## 2019-01-03 DIAGNOSIS — Z1231 Encounter for screening mammogram for malignant neoplasm of breast: Secondary | ICD-10-CM

## 2019-01-07 IMAGING — US US ABDOMEN LIMITED
1 series · 14 of 25 positions shown · non-contrast
Comparison: 07/08/2011

CLINICAL DATA: Right upper quadrant abdominal pain for 4 weeks.

EXAM:
ULTRASOUND ABDOMEN LIMITED RIGHT UPPER QUADRANT

[Series 1: us abdomen limited · 0.17mm/px · 14 of 50 slices shown]
[im 1/50]
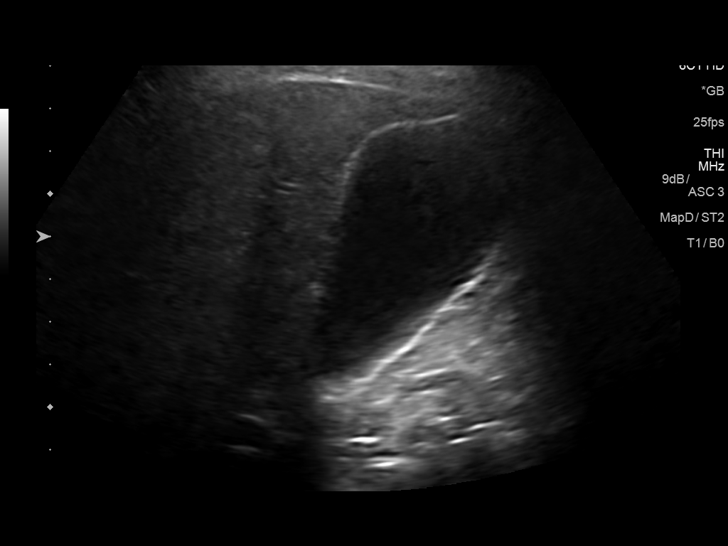
[im 5/50]
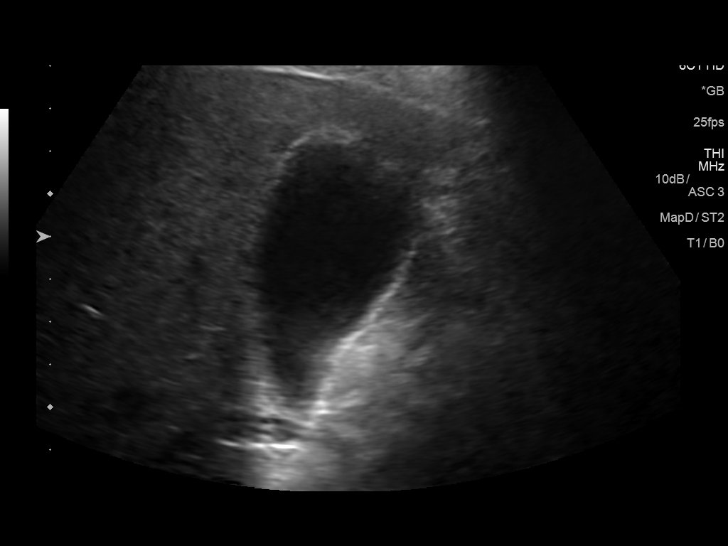
[im 9/50]
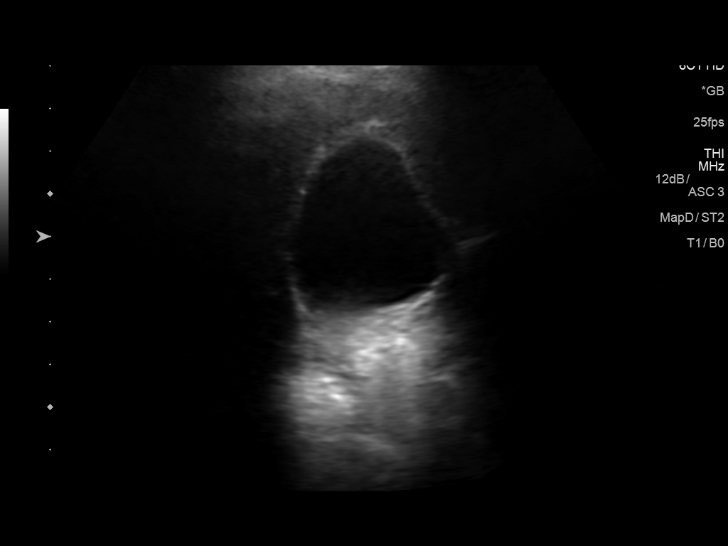
[im 13/50]
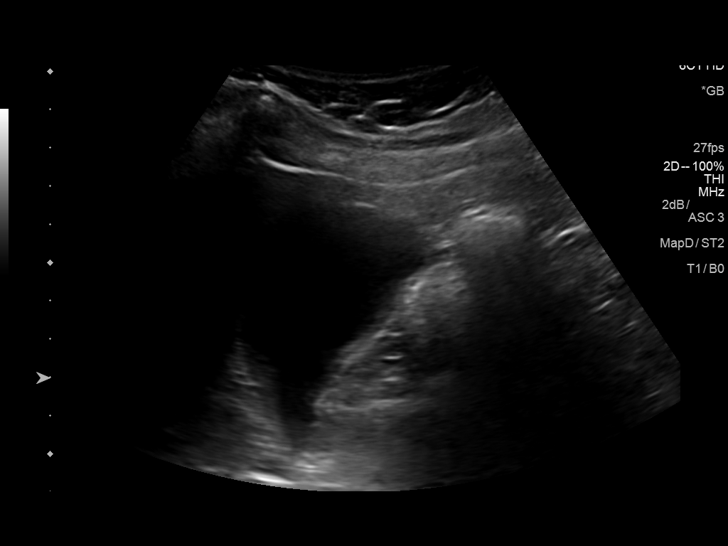
[im 17/50]
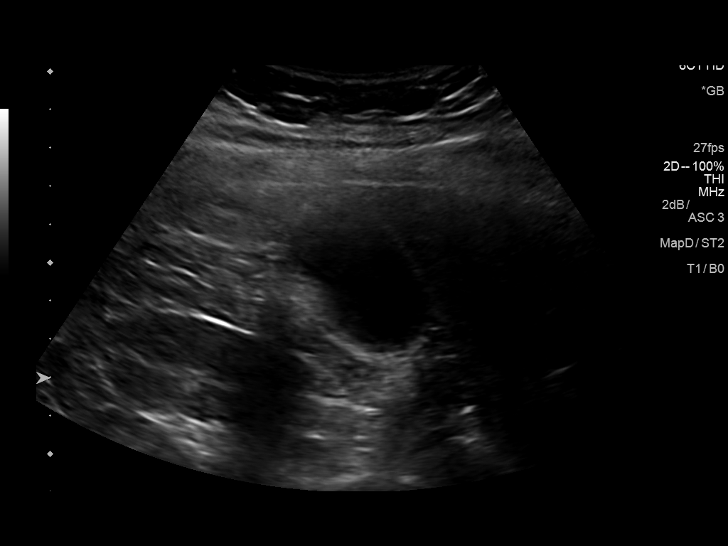
[im 19/50]
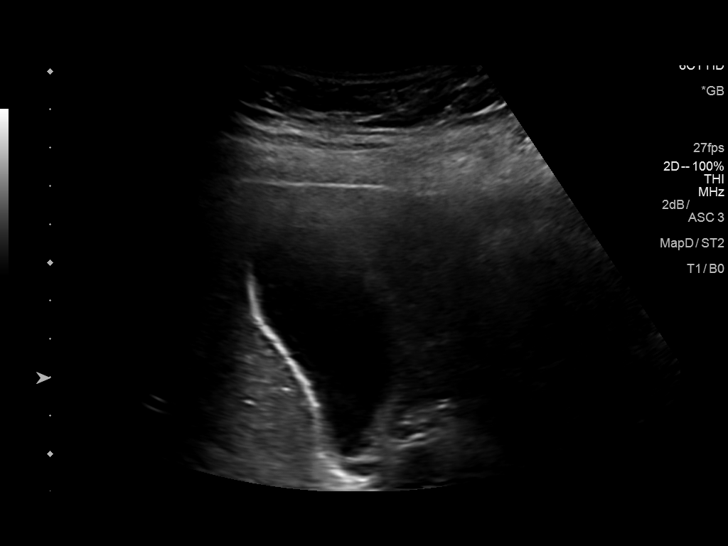
[im 23/50]
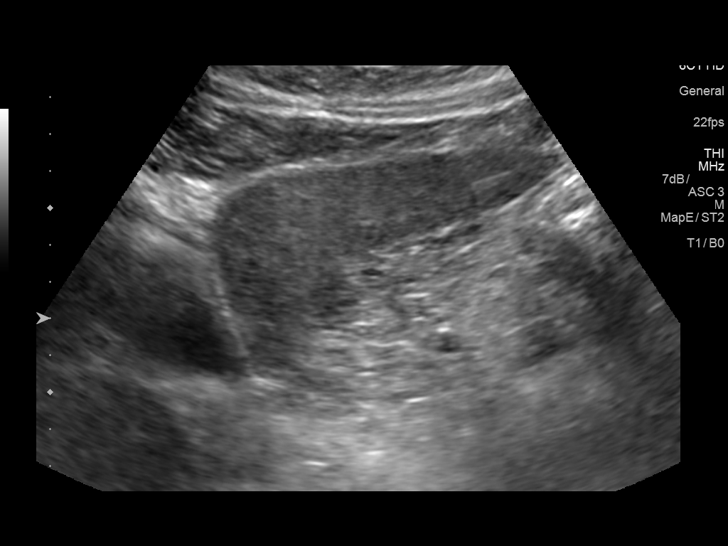
[im 27/50]
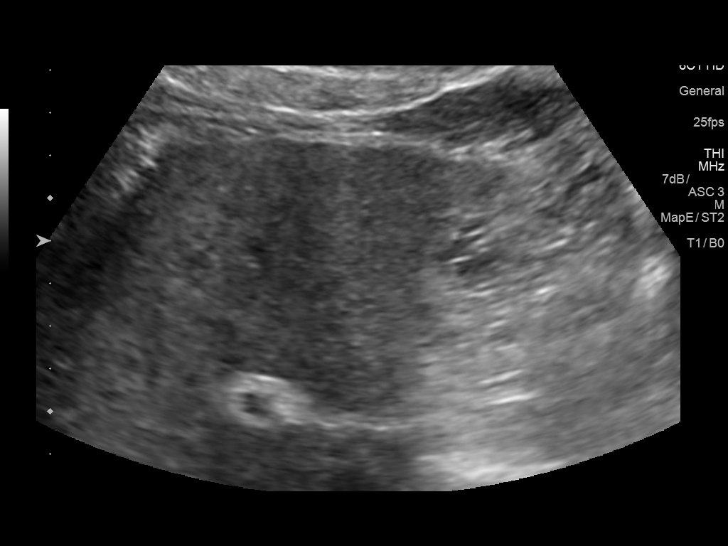
[im 31/50]
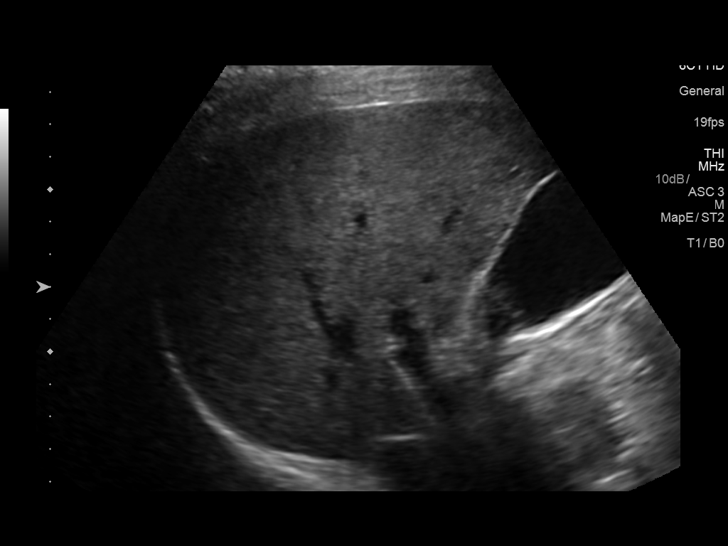
[im 33/50]
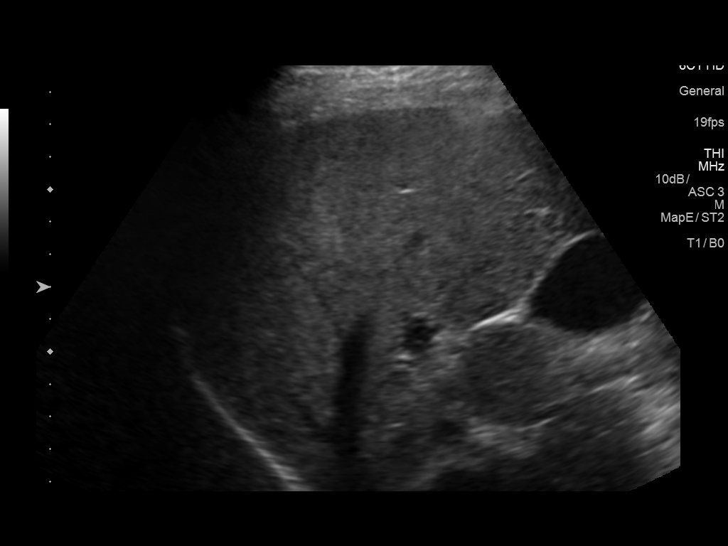
[im 37/50]
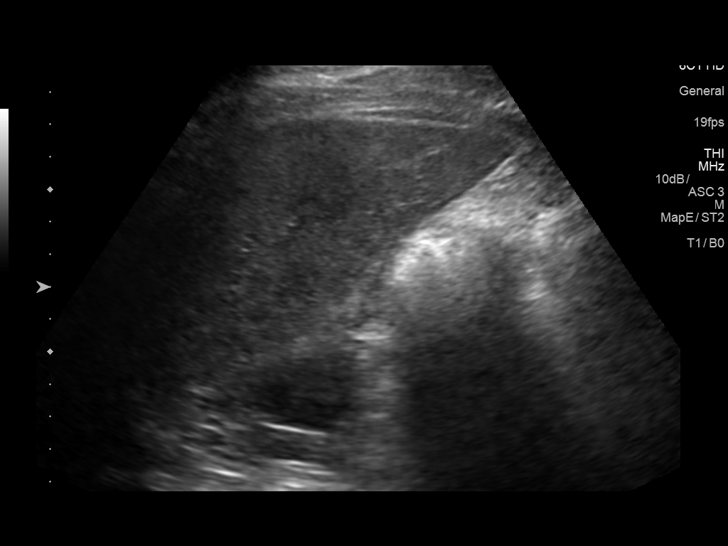
[im 41/50]
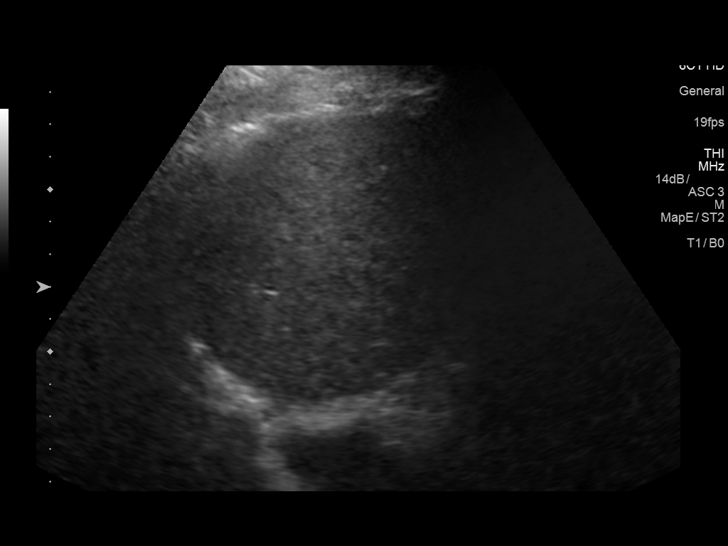
[im 45/50]
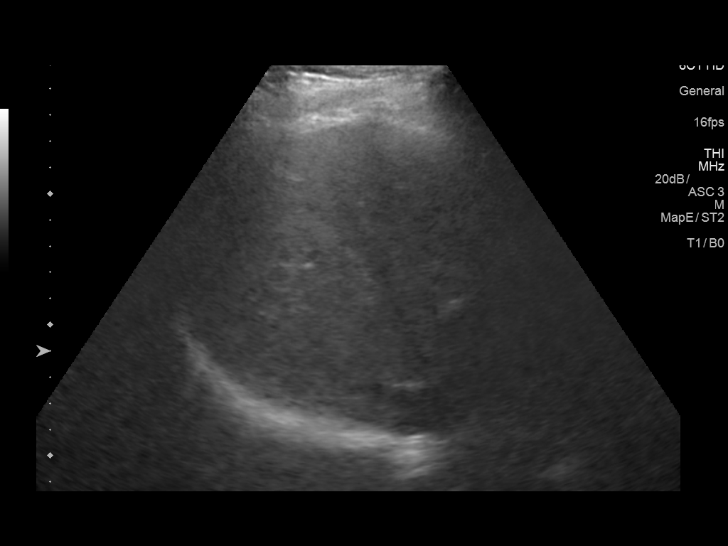
[im 50/50]
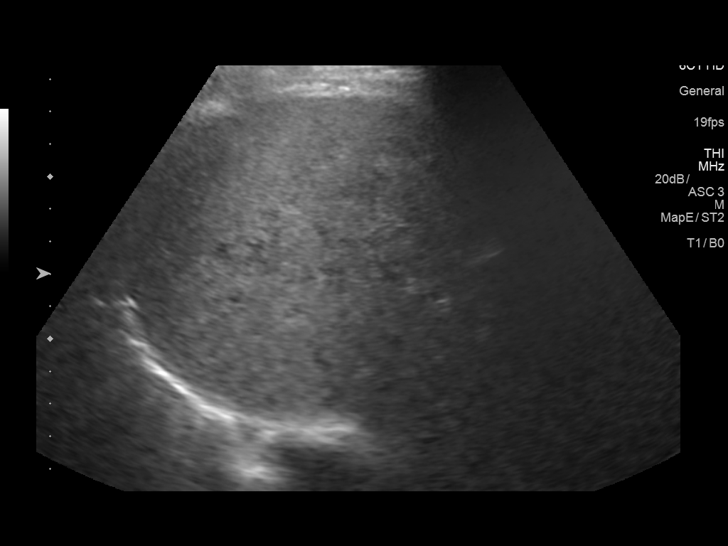

[14 of 25 positions shown; findings below may reference images not displayed]

FINDINGS: Gallbladder:

No gallstones or wall thickening visualized. No sonographic Murphy
sign noted by sonographer.

Common bile duct:

Diameter: 4.2 mm

Liver:

No focal lesion identified. Within normal limits in parenchymal
echogenicity. Portal vein is patent on color Doppler imaging with
normal direction of blood flow towards the liver.
IMPRESSION: Normal right upper quadrant ultrasound.

## 2019-01-10 DIAGNOSIS — F330 Major depressive disorder, recurrent, mild: Secondary | ICD-10-CM | POA: Diagnosis not present

## 2019-02-24 IMAGING — MG DIGITAL SCREENING BILATERAL MAMMOGRAM WITH CAD
5 series · 5 of 5 positions shown · non-contrast
Comparison: Previous exam(s).

CLINICAL DATA: Screening.

EXAM:
DIGITAL SCREENING BILATERAL MAMMOGRAM WITH CAD

[L MLO]
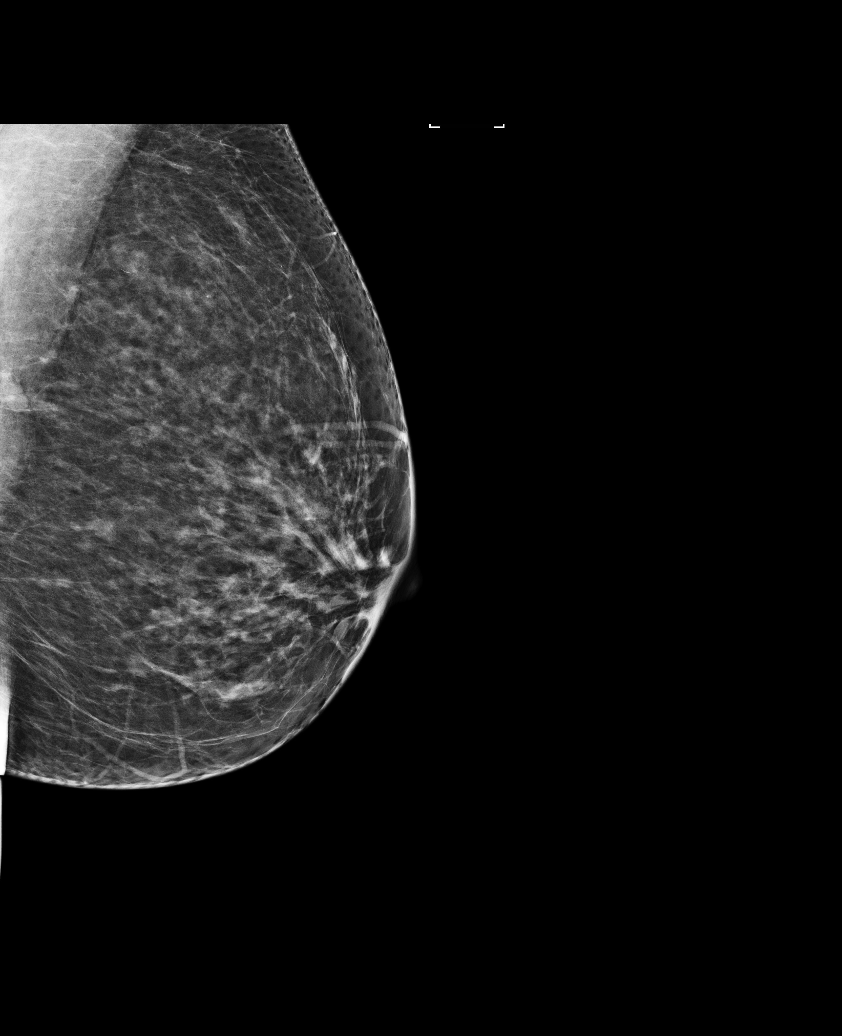

[L CC]
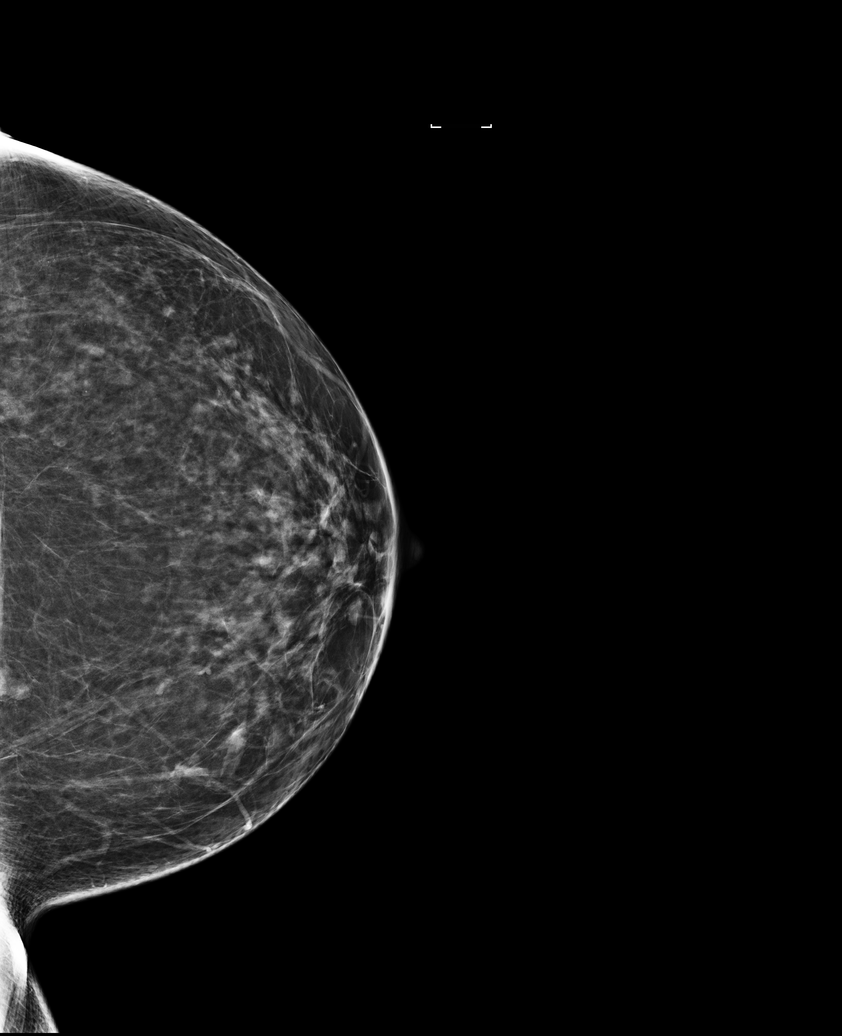

[R MLO (1 of 2)]
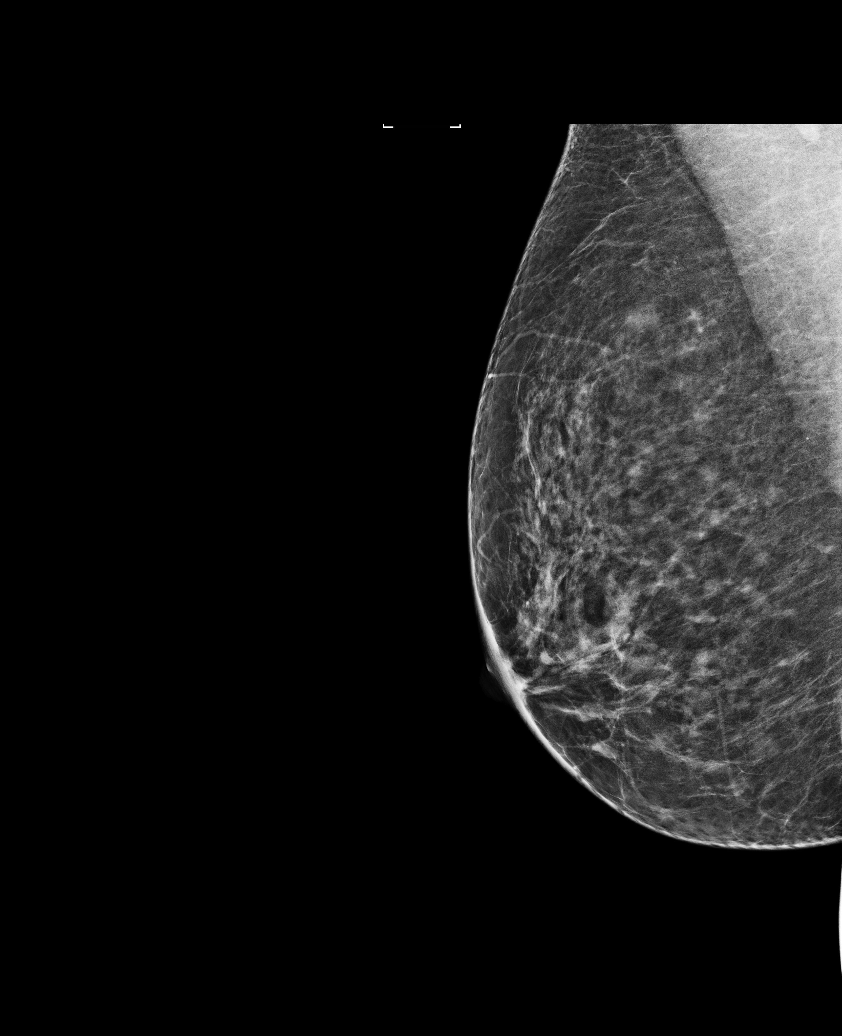

[R MLO (2 of 2)]
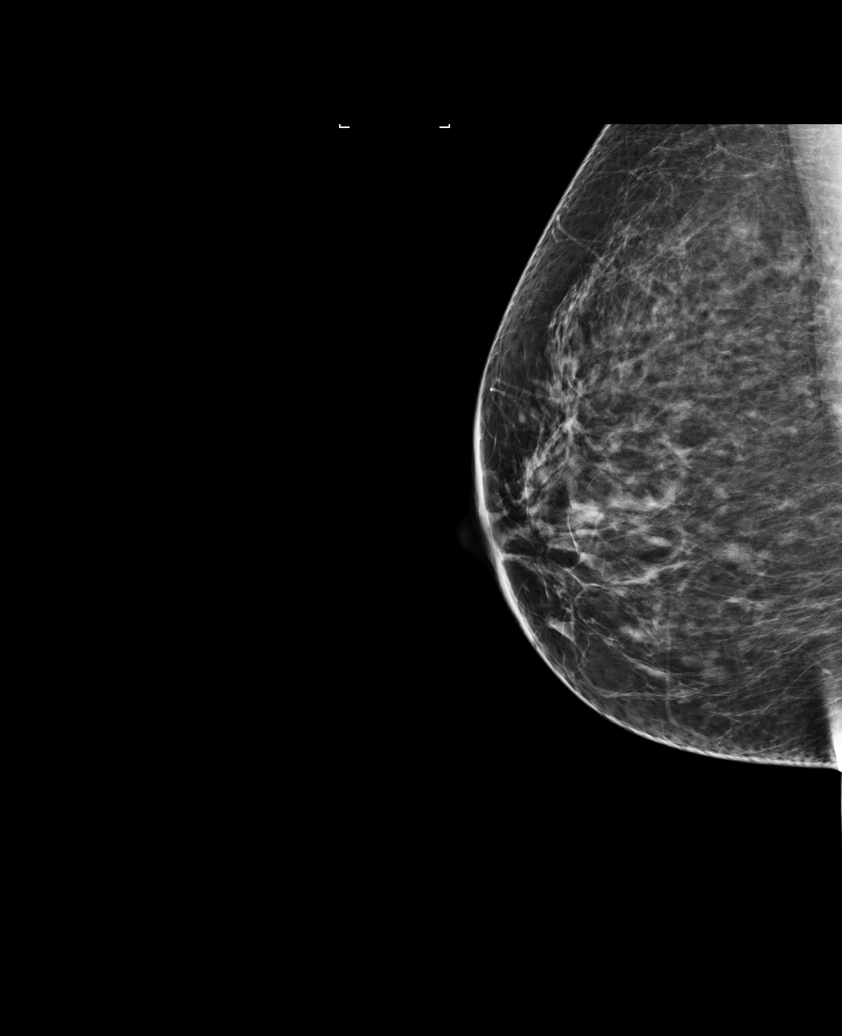

[R CC]
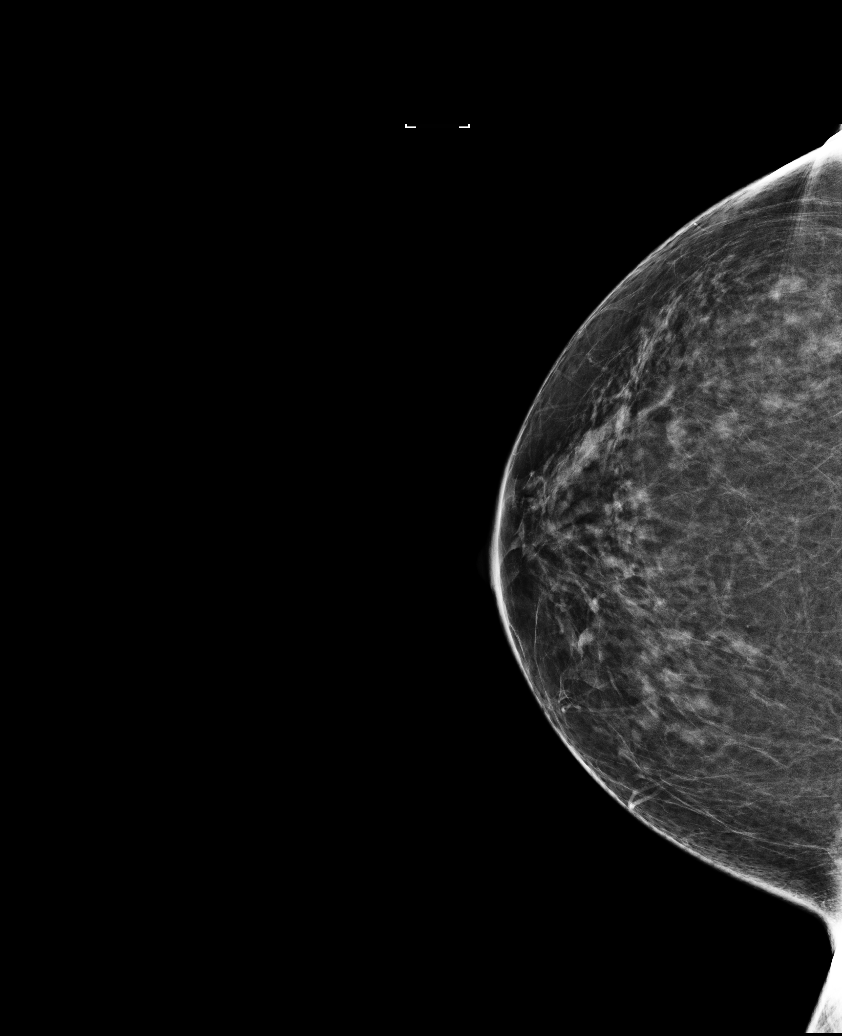

[5 of 5 positions shown; findings below may reference images not displayed]

ACR Breast Density Category b: There are scattered areas of
fibroglandular density.
FINDINGS: There are no findings suspicious for malignancy. Images were
processed with CAD.
IMPRESSION: No mammographic evidence of malignancy. A result letter of this
screening mammogram will be mailed directly to the patient.

RECOMMENDATION:
Screening mammogram in one year. (Code:AS-G-LCT)

BI-RADS CATEGORY  1: Negative.
